# Patient Record
Sex: Male | Born: 1995 | Race: White | Hispanic: No | Marital: Married | Smoking: Never smoker
Health system: Southern US, Community
[De-identification: ages and names within clinical notes are randomized; demographics above are authoritative.]

---
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
## 2016-05-24 ENCOUNTER — Encounter (HOSPITAL_COMMUNITY): Payer: Self-pay

## 2016-05-24 ENCOUNTER — Emergency Department (HOSPITAL_COMMUNITY)
Admission: EM | Admit: 2016-05-24 | Discharge: 2016-05-25 | Disposition: A | Discharge status: Home / Self Care | Payer: 59 | Attending: Emergency Medicine | Admitting: Emergency Medicine

## 2016-05-24 ENCOUNTER — Emergency Department (HOSPITAL_COMMUNITY): Payer: 59

## 2016-05-24 DIAGNOSIS — J069 Acute upper respiratory infection, unspecified: Secondary | ICD-10-CM | POA: Insufficient documentation

## 2016-05-24 DIAGNOSIS — Z79899 Other long term (current) drug therapy: Secondary | ICD-10-CM | POA: Diagnosis not present

## 2016-05-24 DIAGNOSIS — Z792 Long term (current) use of antibiotics: Secondary | ICD-10-CM | POA: Diagnosis not present

## 2016-05-24 DIAGNOSIS — R0789 Other chest pain: Secondary | ICD-10-CM

## 2016-05-24 LAB — I-STAT TROPONIN, ED: TROPONIN I, POC: 0 ng/mL (ref 0.00–0.08)

## 2016-05-24 LAB — BASIC METABOLIC PANEL
ANION GAP: 6 (ref 5–15)
BUN: 11 mg/dL (ref 6–20)
CALCIUM: 9.4 mg/dL (ref 8.9–10.3)
CO2: 29 mmol/L (ref 22–32)
Chloride: 103 mmol/L (ref 101–111)
Creatinine, Ser: 0.92 mg/dL (ref 0.61–1.24)
GFR calc Af Amer: >60 mL/min (ref >60)
GFR calc non Af Amer: >60 mL/min (ref >60)
GLUCOSE: 95 mg/dL (ref 65–99)
Potassium: 3.9 mmol/L (ref 3.5–5.1)
Sodium: 138 mmol/L (ref 135–145)

## 2016-05-24 LAB — CBC
HEMATOCRIT: 43.4 % (ref 39.0–52.0)
HEMOGLOBIN: 14.9 g/dL (ref 13.0–17.0)
MCH: 28 pg (ref 26.0–34.0)
MCHC: 34.3 g/dL (ref 30.0–36.0)
MCV: 81.4 fL (ref 78.0–100.0)
Platelets: 217 10*3/uL (ref 150–400)
RBC: 5.33 MIL/uL (ref 4.22–5.81)
RDW: 12 % (ref 11.5–15.5)
WBC: 6.4 10*3/uL (ref 4.0–10.5)

## 2016-05-24 NOTE — ED Provider Notes (Signed)
CSN: [id number]     Arrival date & time 05/24/16  2019 History   First MD Initiated Contact with Patient 05/24/16 2339     Chief Complaint  Patient presents with  . Chest Pain    (Consider location/radiation/quality/duration/timing/severity/associated sxs/prior Treatment) Patient is a 20 y.o. male presenting with chest pain. The history is provided by the patient and medical records. No language interpreter was used.  Chest Pain Associated symptoms: no abdominal pain, no back pain, no cough, no dizziness, no fever, no headache, no nausea, no shortness of breath and not vomiting      Richard Tucker is a 20 y.o. male  with no pertinent PMH who presents to the Emergency Department complaining of left sided chest tightness which began yesterday morning. Patient states approx. 2 weeks ago he began experiencing nasal congestion, dizziness, and nausea. Saw urgent care and told he had a virus. Went back a week later and given rx for Augmentin for continued symptoms. Patient states his dizziness and nausea resolved but he is still experiencing associated nasal congestion and the left-sided chest pain. He is concerned infection might have spread down towards his lungs. Denies fever, shortness of breath, cough, abdominal pain, n/v, back pain. Has been taking Amoxil, Flonase, and Mucinex as directed by urgent care. No other medications taken for symptoms. No aggravating or alleviating factors noted.    History reviewed. No pertinent past medical history. History reviewed. No pertinent past surgical history. No family history on file. Social History  Substance Use Topics  . Smoking status: Never Smoker   . Smokeless tobacco: None  . Alcohol Use: No    Review of Systems  Constitutional: Negative for fever and chills.  HENT: Positive for congestion.   Eyes: Negative for visual disturbance.  Respiratory: Negative for cough and shortness of breath.   Cardiovascular: Positive for chest pain.    Gastrointestinal: Negative for nausea, vomiting, abdominal pain and diarrhea.  Musculoskeletal: Negative for myalgias and back pain.  Skin: Negative for rash.  Allergic/Immunologic: Negative for immunocompromised state.  Neurological: Negative for dizziness and headaches.      Allergies  Review of patient's allergies indicates no known allergies.  Home Medications   Prior to Admission medications   Medication Sig Start Date End Date Taking? Authorizing Provider  acetaminophen (TYLENOL) 325 MG tablet Take 325 mg by mouth every 6 (six) hours as needed for mild pain.   Yes Historical Provider, MD  amoxicillin (AMOXIL) 875 MG tablet Take 875 mg by mouth 2 (two) times daily.   Yes Historical Provider, MD  fluticasone (FLONASE) 50 MCG/ACT nasal spray Place 1 spray into both nostrils daily.   Yes Historical Provider, MD  naproxen (NAPROSYN) 500 MG tablet Take 500 mg by mouth 2 (two) times daily as needed for mild pain.   Yes Historical Provider, MD  pseudoephedrine (SUDAFED) 30 MG tablet Take 60 mg by mouth every 4 (four) hours as needed for congestion.   Yes Historical Provider, MD  ibuprofen (ADVIL,MOTRIN) 800 MG tablet Take 1 tablet (800 mg total) by mouth every 8 (eight) hours as needed. 05/25/16   Jaime Pilcher Ward, PA-C   BP 129/82 mmHg  Pulse 75  Temp(Src) 99 F (37.2 C) (Oral)  Resp 16  SpO2 100% Physical Exam  Constitutional: He is oriented to person, place, and time. He appears well-developed and well-nourished.  Alert and in no acute distress  HENT:  Head: Normocephalic and atraumatic.  Right Ear: Tympanic membrane normal.  Left Ear: Tympanic membrane  normal.  OP with erythema, no exudates or tonsillar hypertrophy. + PND  Cardiovascular: Normal rate, regular rhythm, normal heart sounds and intact distal pulses.  Exam reveals no gallop and no friction rub.   No murmur heard. Pulmonary/Chest: Effort normal and breath sounds normal. No respiratory distress. He has no  wheezes. He has no rales. He exhibits tenderness.    Abdominal: Soft. He exhibits no distension. There is no tenderness.  Musculoskeletal: He exhibits no edema.  Neurological: He is alert and oriented to person, place, and time.  Skin: Skin is warm and dry.  Nursing note and vitals reviewed.   ED Course  Procedures (including critical care time) Labs Review Labs Reviewed  BASIC METABOLIC PANEL  CBC  I-STAT TROPOININ, ED  Rosezena SensorI-STAT TROPOININ, ED    Imaging Review Dg Chest 2 View  05/24/2016  CLINICAL DATA:  Chest pain for 1 day. EXAM: CHEST  2 VIEW COMPARISON:  None. FINDINGS: Normal sized heart.  Clear lungs.  Minimal scoliosis. IMPRESSION: No acute abnormality. Electronically Signed   By: Beckie SaltsSteven  Reid M.D.   On: 05/24/2016 21:05   I have personally reviewed and evaluated these images and lab results as part of my medical decision-making.   EKG Interpretation   Date/Time:  Saturday May 24 2016 20:26:19 EDT Ventricular Rate:  81 PR Interval:  134 QRS Duration: 94 QT Interval:  348 QTC Calculation: 404 R Axis:   35 Text Interpretation:  Normal sinus rhythm with sinus arrhythmia Normal ECG  No previous ECGs available Confirmed by Manus GunningANCOUR  MD, STEPHEN [id number]) on  05/24/2016 11:25:20 PM      MDM   Final diagnoses:  URI (upper respiratory infection)  Atypical chest pain   Richard Tucker presents to ED for chest pain since yesterday. Has been seen by urgent care multiple times for URI symptoms in the past two weeks. Currently on amoxil, flonase, and mucinex for symptoms. Afebrile and VSS. Lungs CTA bilaterally on exam. Symptoms likely 2/2 URI. Will get 2nd trop.   Labs: CBC, BMP wdl. Negative troponin x 2 Imaging: CXR with no acute cardiopulmonary disease.  EKG reviewed and reassuring.   Patient is to be discharged with recommendation to follow up with PCP in regards to today's hospital visit. Patient's symptoms unlikely to be of CAD etiology. Labs and imaging reviewed again  prior to discharge. CXR and ECG with no acute abnormalities and neg troponins x2. Patient has been advised return to the ED if develop any exertional chest pain- strict return precautions discussed and all questions answered. Patient appears reliable for follow up and is agreeable to plan as dictated above.   [facility] TexarkanaJaime Pilcher Ward, PA-C 05/25/16 [id number]  Glynn OctaveStephen Rancour, MD 05/25/16 [phone]

## 2016-05-24 NOTE — ED Notes (Addendum)
Pt reports a tightness to center of his chest that started yesterday morning. [facility] and was told that he had a virus.

## 2016-05-25 LAB — I-STAT TROPONIN, ED: TROPONIN I, POC: 0 ng/mL (ref 0.00–0.08)

## 2016-05-25 MED ORDER — IBUPROFEN 800 MG PO TABS: 800.0000 mg | ORAL_TABLET | Freq: Three times a day (TID) | ORAL | Status: AC | PRN

## 2016-05-25 NOTE — Discharge Instructions (Signed)
1. Medications: flonase and mucinex for nasal congestion, ibuprofen as needed for chest discomfort, continue usual home medications 2. Treatment: rest, drink plenty of fluids 3. Follow Up: Please follow up with your primary doctor at scheduled appointment on Tuesday; Return to the ER for chest pain on exertion, high fevers, difficulty breathing or other concerning symptoms

## 2016-05-25 NOTE — ED Notes (Signed)
Patient able to ambulate independently
